# Patient Record
Sex: Male | Born: 1970
Health system: Southern US, Community
[De-identification: ages and names within clinical notes are randomized; demographics above are authoritative.]

## PROBLEM LIST (undated history)

## (undated) DIAGNOSIS — F909 Attention-deficit hyperactivity disorder, unspecified type: Secondary | ICD-10-CM

---

## 2010-08-31 ENCOUNTER — Ambulatory Visit: Payer: Self-pay | Admitting: Internal Medicine

## 2012-06-07 ENCOUNTER — Ambulatory Visit: Payer: Self-pay | Admitting: Internal Medicine

## 2013-07-03 IMAGING — CR RIGHT FOOT COMPLETE - 3+ VIEW
1 series · 3 of 3 positions shown · non-contrast
Comparison: none

REASON FOR EXAM: right foot pain
COMMENTS:

PROCEDURE:     MDR - MDR FOOT RT COMP W/OBLIQUES  - June 07, 2012  [DATE]
RESULT:     Comparison:  None

[Series 1: ap · 0.17mm/px · 3 of 3 slices shown]
[im 1/3]
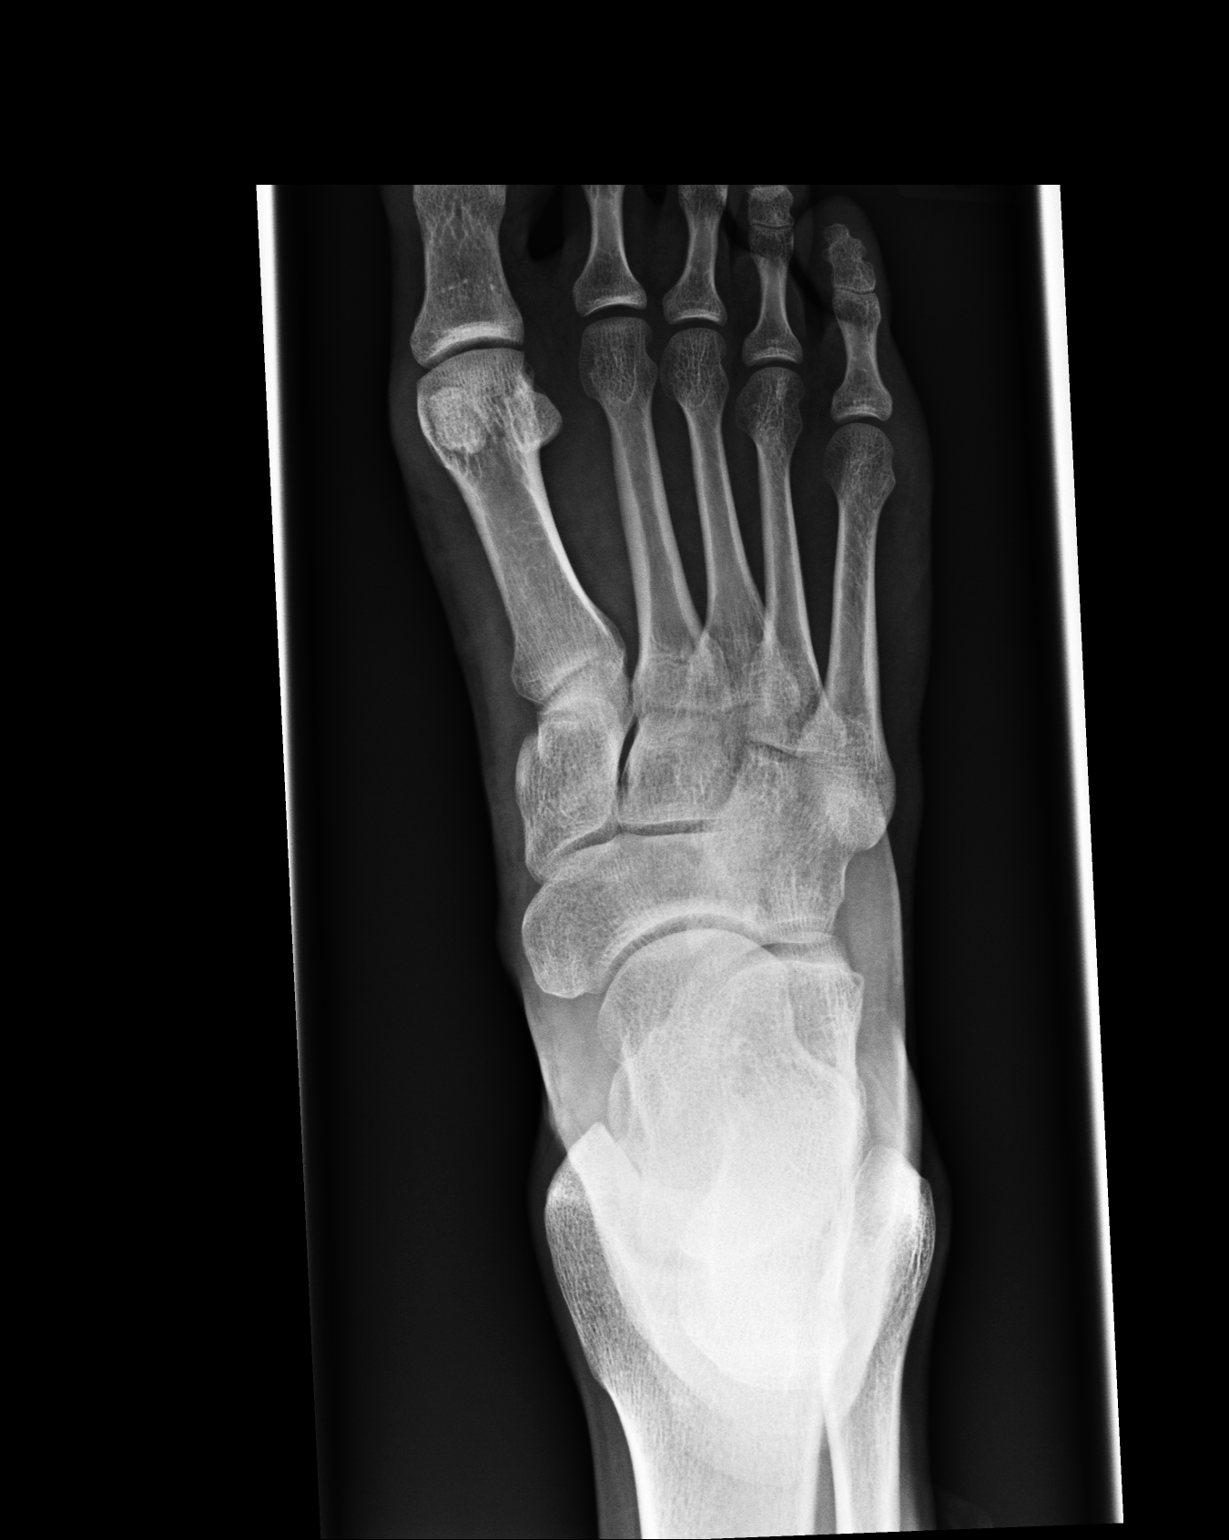
[im 2/3]
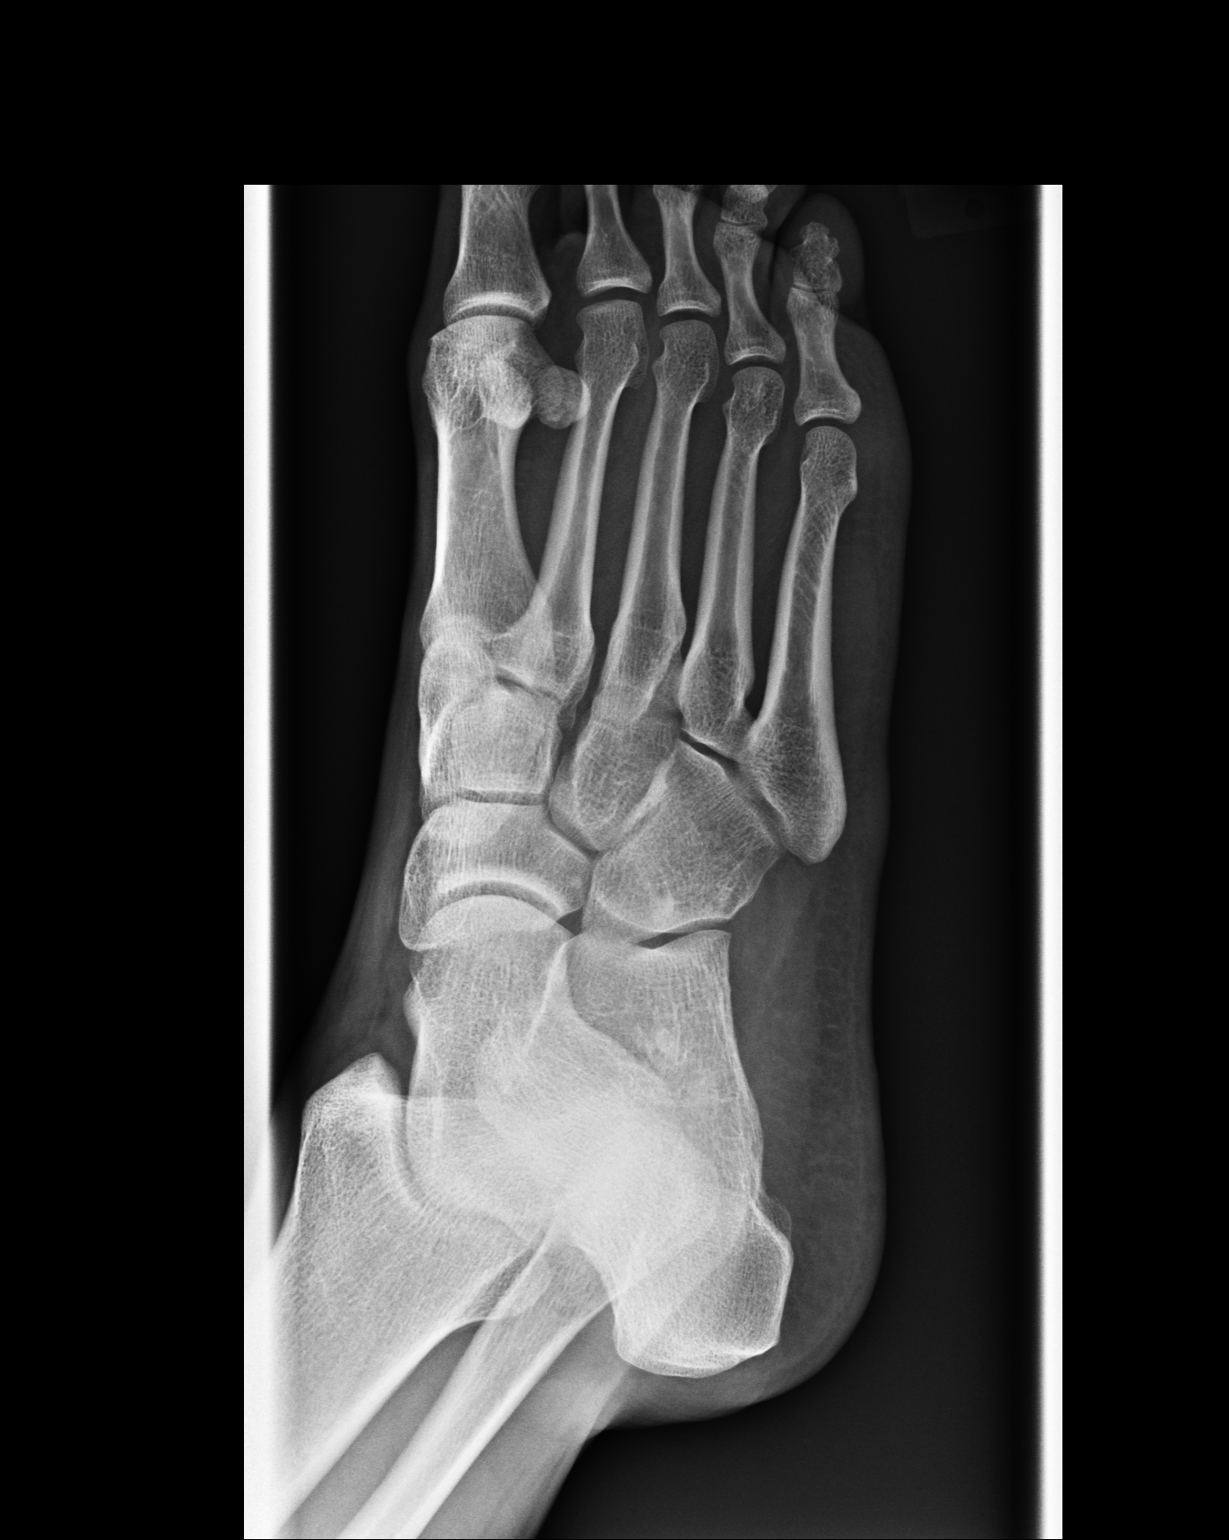
[im 3/3]
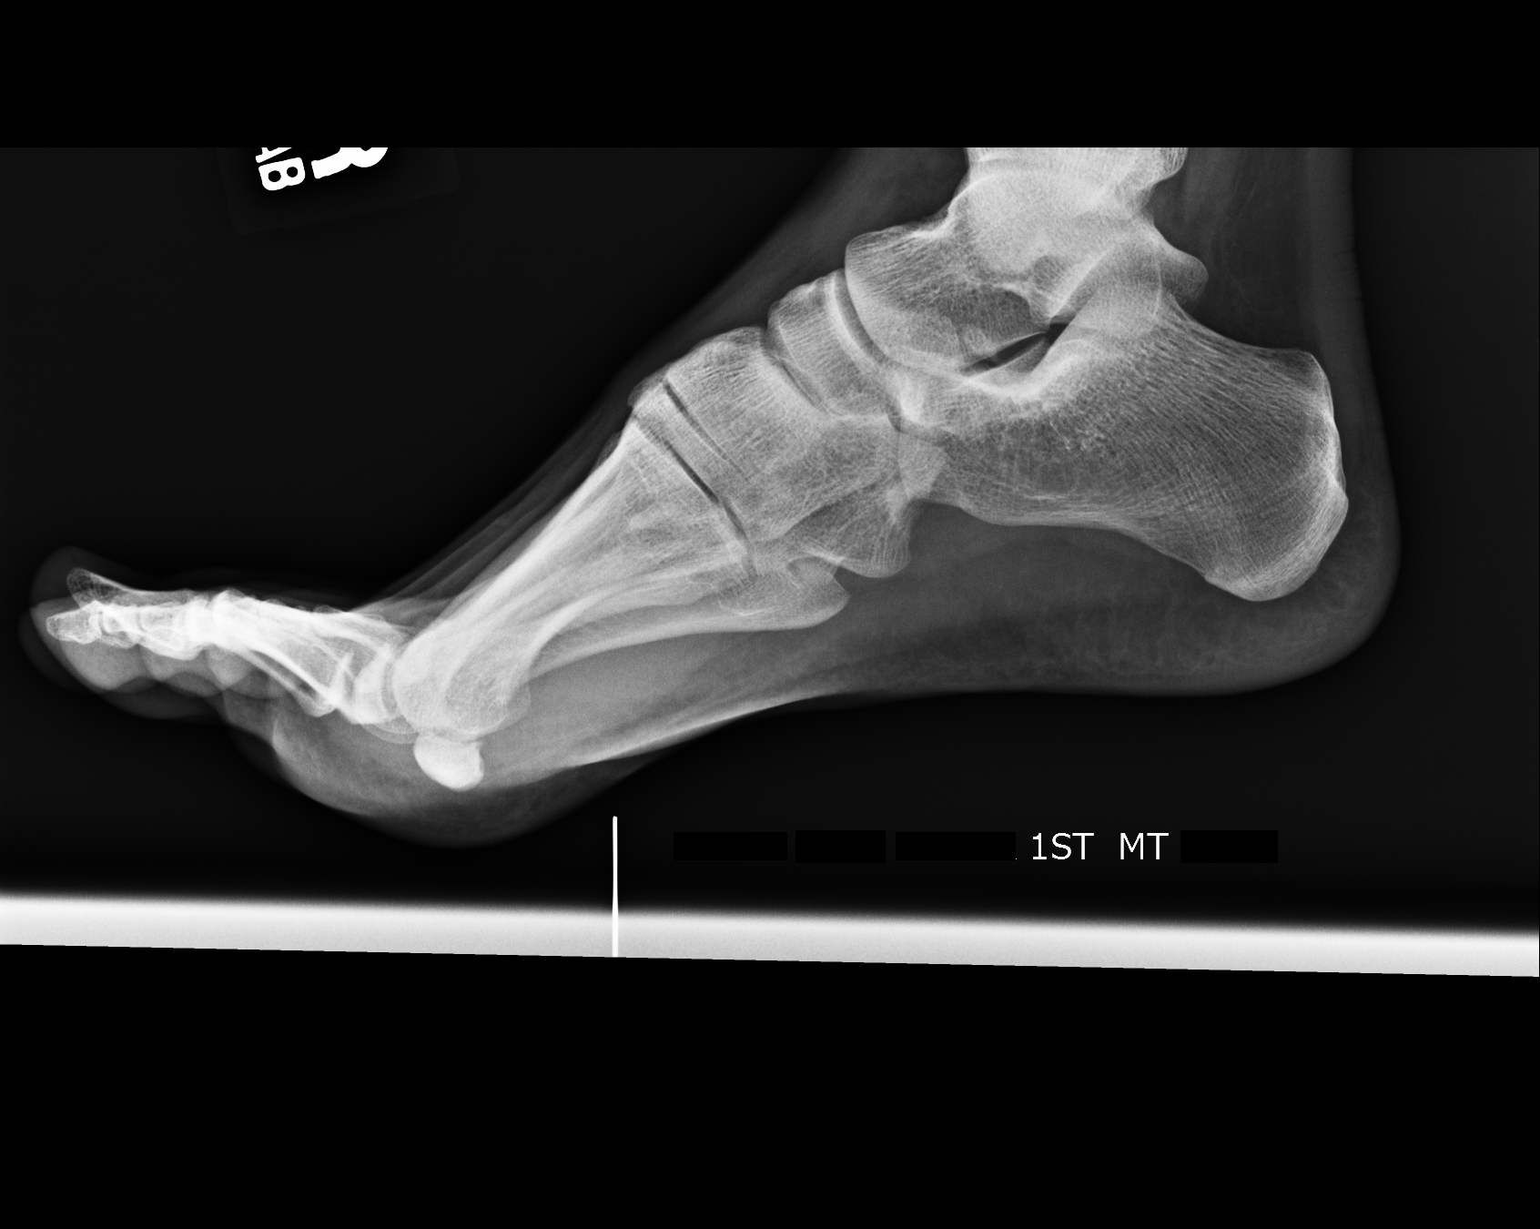

[3 of 3 positions shown; findings below may reference images not displayed]

FINDINGS: AP, oblique, and lateral views of the right foot demonstrates no fracture or
dislocation. There is no soft tissue abnormality. There is no subcutaneous
emphysema or radiopaque foreign bodies.
IMPRESSION: No acute osseous injury of the right foot.

[REDACTED]

## 2015-02-06 ENCOUNTER — Ambulatory Visit
Admission: EM | Admit: 2015-02-06 | Discharge: 2015-02-06 | Disposition: A | Discharge status: Home / Self Care | Payer: Managed Care, Other (non HMO) | Attending: Internal Medicine | Admitting: Internal Medicine

## 2015-02-06 DIAGNOSIS — T148XXA Other injury of unspecified body region, initial encounter: Secondary | ICD-10-CM

## 2015-02-06 DIAGNOSIS — T148 Other injury of unspecified body region: Secondary | ICD-10-CM

## 2015-02-06 DIAGNOSIS — L810 Postinflammatory hyperpigmentation: Secondary | ICD-10-CM | POA: Diagnosis not present

## 2015-02-06 HISTORY — DX: Attention-deficit hyperactivity disorder, unspecified type: F90.9

## 2015-02-06 NOTE — ED Provider Notes (Signed)
CSN: [id number]     Arrival date & time 02/06/15  1412 History   First MD Initiated Contact with Patient 02/06/15 1445     Chief Complaint  Patient presents with  . Rash   HPI  Patient is a 44 year old gentleman with a mark under his left arm, present for about a week,? Minor trauma. Not really painful, does not itch. No other marks like it elsewhere. He is also concerned about persistent marks from an episode of shingles 8 weeks ago, on the right chest. He has tried over-the-counter fade creams without sufficient results. No fever, no malaise. 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  Past Medical History  Diagnosis Date  . Attention deficit disorder with hyperactivity    History reviewed. No pertinent past surgical history. Family History  Problem Relation Age of Onset  . Cirrhosis Mother    History  Substance Use Topics  . Smoking status: Former Games developer  . Smokeless tobacco: Not on file  . Alcohol Use: Yes     Comment: socially    Review of Systems  All other systems reviewed and are negative.   Allergies  Codeine sulfate  Home Medications   Prior to Admission medications   Medication Sig Start Date End Date Taking? Authorizing Provider  amphetamine-dextroamphetamine (ADDERALL XR) 10 MG 24 hr capsule Take 10 mg by mouth 4 (four) times daily as needed.   Yes Historical Provider, MD  amphetamine-dextroamphetamine (ADDERALL) 30 MG tablet Take 30 mg by mouth 2 (two) times daily.   Yes Historical Provider, MD   BP 155/103 mmHg  Pulse 105  Temp(Src) 97.9 F (36.6 C) (Oral)  Resp 17  Ht 5\' 11"  (1.803 m)  Wt 179 lb (81.194 kg)  BMI 24.98 kg/m2  SpO2 97% Physical Exam  Constitutional: He is oriented to person, place, and time. No distress.  Alert, nicely groomed  HENT:  Head: Atraumatic.  Eyes:  Conjugate gaze, no eye redness/drainage  Neck: Neck supple.  Cardiovascular: Normal rate.   Pulmonary/Chest: No respiratory distress.  Abdominal: Soft. He exhibits no  distension.  Musculoskeletal: Normal range of motion.  Neurological: He is alert and oriented to person, place, and time.  Skin: Skin is warm and dry.  No cyanosis 1 x 1.5" fading bruise under the left arm in the left mid chest. No erythema, skin is intact. Not tender. Not swollen. Hyperpigmented patches on the right chest consistent with prior history of shingles. These are partially blanching.  Nursing note and vitals reviewed.   ED Course  Procedures   MDM   1. Post-inflammatory hyperpigmentation   2. Bruise of muscle    No evidence of dangerous underlying condition on exam today. A dermatologist might have thoughts about other options for continuing to fade the post-shingles rash. Recheck as needed     Eustace Moore, MD 02/06/15 [phone]

## 2015-02-06 NOTE — Discharge Instructions (Signed)
Marks from shingles will continue to fade over the next 6-12 weeks. A little sun may be helpful in disguising the marks while they heal. A dermatologist may have other suggestions. The mark under the L arm is a bruise and will continue to fade in the next 1-2 weeks.

## 2015-02-06 NOTE — ED Notes (Signed)
Noted a ?bite? ? Rash? Size of 25 cent piece left lateral upper chest today. Had shingles 2 months ago on right anterior ribs

## 2015-02-13 ENCOUNTER — Other Ambulatory Visit: Payer: Self-pay | Admitting: Internal Medicine

## 2015-02-18 ENCOUNTER — Encounter: Payer: Self-pay | Admitting: Internal Medicine

## 2015-02-28 ENCOUNTER — Other Ambulatory Visit: Payer: Self-pay | Admitting: Internal Medicine

## 2015-02-28 MED ORDER — TESTOSTERONE 50 MG/5GM (1%) TD GEL: 10.0000 g | Freq: Every day | TRANSDERMAL | Status: DC

## 2015-03-14 ENCOUNTER — Ambulatory Visit: Payer: Self-pay | Admitting: Internal Medicine

## 2015-03-21 ENCOUNTER — Other Ambulatory Visit: Payer: Self-pay | Admitting: Internal Medicine

## 2015-04-11 ENCOUNTER — Ambulatory Visit: Payer: Self-pay | Admitting: Internal Medicine

## 2015-08-20 ENCOUNTER — Ambulatory Visit (INDEPENDENT_AMBULATORY_CARE_PROVIDER_SITE_OTHER): Payer: 59 | Admitting: Family Medicine

## 2015-08-20 ENCOUNTER — Encounter: Payer: Self-pay | Admitting: Family Medicine

## 2015-08-20 VITALS — BP 118/86 | HR 83 | Temp 97.3°F | Ht 71.0 in | Wt 170.1 lb

## 2015-08-20 DIAGNOSIS — Z23 Encounter for immunization: Secondary | ICD-10-CM

## 2015-08-20 DIAGNOSIS — J40 Bronchitis, not specified as acute or chronic: Secondary | ICD-10-CM | POA: Diagnosis not present

## 2015-08-20 DIAGNOSIS — E291 Testicular hypofunction: Secondary | ICD-10-CM

## 2015-08-20 DIAGNOSIS — E039 Hypothyroidism, unspecified: Secondary | ICD-10-CM | POA: Diagnosis not present

## 2015-08-20 DIAGNOSIS — F909 Attention-deficit hyperactivity disorder, unspecified type: Secondary | ICD-10-CM | POA: Diagnosis not present

## 2015-08-20 DIAGNOSIS — F988 Other specified behavioral and emotional disorders with onset usually occurring in childhood and adolescence: Secondary | ICD-10-CM

## 2015-08-20 MED ORDER — DOXYCYCLINE HYCLATE 100 MG PO CAPS: 100.0000 mg | ORAL_CAPSULE | Freq: Two times a day (BID) | ORAL | Status: DC

## 2015-08-20 NOTE — Patient Instructions (Signed)

## 2015-08-21 DIAGNOSIS — E039 Hypothyroidism, unspecified: Secondary | ICD-10-CM | POA: Insufficient documentation

## 2015-08-21 DIAGNOSIS — E291 Testicular hypofunction: Secondary | ICD-10-CM | POA: Insufficient documentation

## 2015-08-21 DIAGNOSIS — F988 Other specified behavioral and emotional disorders with onset usually occurring in childhood and adolescence: Secondary | ICD-10-CM | POA: Insufficient documentation

## 2015-08-21 NOTE — Progress Notes (Signed)
Date:  08/20/2015   Name:  Nicholas PippinsWalter G Hindle Jr.   DOB:  06-19-1971   MRN:  [id number]  PCP:  No primary care provider on file.    Chief Complaint: Cough   History of Present Illness:  This is a 45 y.o. male with 2 week hx of cough prod yellow phlegm, sore throat, rhinorrhea, and sinus congestion, feels sxs getting worse. Needs flu imm.  Review of Systems:  Review of Systems  Constitutional: Negative for fatigue.  HENT: Negative for ear pain.   Respiratory: Negative for shortness of breath.     There are no active problems to display for this patient.   Prior to Admission medications   Medication Sig Start Date End Date Taking? Authorizing Provider  ARIPiprazole (ABILIFY) 20 MG tablet Take 20 mg by mouth every morning. 02/06/15  Yes Historical Provider, MD  levothyroxine (SYNTHROID, LEVOTHROID) 75 MCG tablet TAKE 1 TABLET BY MOUTH EVERY DAY 03/23/15  Yes Reubin MilanLaura H Berglund, MD  methylphenidate 36 MG PO CR tablet Take 1 tablet by mouth 2 (two) times daily. 02/23/15  Yes Historical Provider, MD  testosterone (ANDROGEL) 50 MG/5GM (1%) GEL Place 10 g onto the skin daily. 02/28/15  Yes Reubin MilanLaura H Berglund, MD  doxycycline (VIBRAMYCIN) 100 MG capsule Take 1 capsule (100 mg total) by mouth 2 (two) times daily. 08/20/15   Schuyler AmorWilliam Zain Lankford, MD    Allergies  Allergen Reactions  . Biaxin [Clarithromycin] Diarrhea  . Codeine Sulfate Itching    No past surgical history on file.  Social History  Substance Use Topics  . Smoking status: Former Smoker    Quit date: 12/15/2010  . Smokeless tobacco: None  . Alcohol Use: 0.0 oz/week    0 Standard drinks or equivalent per week     Comment: socially    Family History  Problem Relation Age of Onset  . Cirrhosis Mother   . Hypothyroidism Father   . Diabetes Maternal Grandmother   . Diabetes Paternal Grandmother     Medication list has been reviewed and updated.  Physical Examination: BP 118/86 mmHg  Pulse 83  Temp(Src) 97.3 F (36.3 C)  Ht 5\' 11"   (1.803 m)  Wt 170 lb 1.6 oz (77.157 kg)  BMI 23.73 kg/m2  SpO2 98%  Physical Exam  Constitutional: He appears well-developed and well-nourished.  HENT:  Nose: Nose normal.  Mouth/Throat: Oropharynx is clear and moist.  Neck: Neck supple.  Pulmonary/Chest: Effort normal and breath sounds normal.  Lymphadenopathy:    He has no cervical adenopathy.  Neurological: He is alert.  Skin: Skin is warm and dry.  Psychiatric: He has a normal mood and affect. His behavior is normal.  Nursing note and vitals reviewed.   Assessment and Plan:  1. Bronchitis, persistent Doxy x 5d (allergic Biaxin)  2. Flu vaccine need - Flu Vaccine QUAD 36+ mos PF IM (Fluarix & Fluzone Quad PF)  Return if symptoms worsen or fail to improve.  Dionne AnoWilliam M. Kingsley SpittlePlonk, Jr. MD [facility]  08/21/2015

## 2015-09-01 ENCOUNTER — Other Ambulatory Visit: Payer: Self-pay | Admitting: Internal Medicine

## 2015-09-02 ENCOUNTER — Other Ambulatory Visit: Payer: Self-pay | Admitting: Internal Medicine

## 2015-09-15 ENCOUNTER — Other Ambulatory Visit: Payer: Self-pay | Admitting: Internal Medicine

## 2015-09-15 MED ORDER — TESTOSTERONE 50 MG/5GM (1%) TD GEL: 10.0000 g | Freq: Every day | TRANSDERMAL | Status: DC

## 2015-09-15 NOTE — Telephone Encounter (Signed)
pts coming in on march 7th

## 2015-10-21 ENCOUNTER — Ambulatory Visit: Payer: 59 | Admitting: Internal Medicine

## 2015-10-27 ENCOUNTER — Ambulatory Visit (INDEPENDENT_AMBULATORY_CARE_PROVIDER_SITE_OTHER): Payer: 59 | Admitting: Internal Medicine

## 2015-10-27 ENCOUNTER — Encounter: Payer: Self-pay | Admitting: Internal Medicine

## 2015-10-27 VITALS — BP 120/62 | HR 104 | Ht 71.0 in | Wt 171.0 lb

## 2015-10-27 DIAGNOSIS — R0981 Nasal congestion: Secondary | ICD-10-CM | POA: Diagnosis not present

## 2015-10-27 DIAGNOSIS — G44219 Episodic tension-type headache, not intractable: Secondary | ICD-10-CM

## 2015-10-27 MED ORDER — FLUTICASONE PROPIONATE 50 MCG/ACT NA SUSP: 2.0000 | Freq: Every day | NASAL | Status: DC

## 2015-10-27 NOTE — Progress Notes (Signed)
    Date:  10/27/2015   Name:  Nicholas PippinsWalter G Prasad Jr.   DOB:  [birth year]   MRN:  [id number]   Chief Complaint: Migraine Migraine  This is a new problem. The current episode started in the past 7 days. The problem occurs constantly. Associated symptoms include sinus pressure and a sore throat. Pertinent negatives include no ear pain or fever.  Sinusitis This is a new problem. The current episode started in the past 7 days. The problem is unchanged. Associated symptoms include congestion, headaches, sinus pressure and a sore throat. Pertinent negatives include no diaphoresis, ear pain or shortness of breath.      Review of Systems  Constitutional: Negative for fever, diaphoresis and unexpected weight change.  HENT: Positive for congestion, sinus pressure and sore throat. Negative for ear pain.   Eyes: Negative for visual disturbance.  Respiratory: Negative for chest tightness and shortness of breath.   Cardiovascular: Negative for chest pain and palpitations.  Neurological: Positive for headaches.  Psychiatric/Behavioral: Positive for sleep disturbance. The patient is nervous/anxious.     Patient Active Problem List   Diagnosis Date Noted  . Hypothyroidism 08/21/2015  . Hypogonadism, male 08/21/2015  . ADD (attention deficit disorder) 08/21/2015    Prior to Admission medications   Medication Sig Start Date End Date Taking? Authorizing Provider  ALPRAZolam Prudy Feeler(XANAX) 0.5 MG tablet Take 1 tablet by mouth daily. 10/13/15  Yes Historical Provider, MD  ARIPiprazole (ABILIFY) 30 MG tablet Take 30 mg by mouth every morning. 09/01/15  Yes Historical Provider, MD  cloNIDine (CATAPRES) 0.1 MG tablet Take 1 tablet by mouth daily. 10/13/15  Yes Historical Provider, MD  levothyroxine (SYNTHROID, LEVOTHROID) 75 MCG tablet TAKE 1 TABLET BY MOUTH EVERY DAY 03/23/15  Yes Reubin MilanLaura H Berglund, MD  testosterone (ANDROGEL) 50 MG/5GM (1%) GEL Place 10 g onto the skin daily. Patient not taking: Reported on 10/27/2015  09/15/15   Reubin MilanLaura H Berglund, MD    Allergies  Allergen Reactions  . Biaxin [Clarithromycin] Diarrhea  . Codeine Sulfate Itching    History reviewed. No pertinent past surgical history.  Social History  Substance Use Topics  . Smoking status: Former Smoker    Quit date: 12/15/2010  . Smokeless tobacco: None  . Alcohol Use: 0.0 oz/week    0 Standard drinks or equivalent per week     Comment: socially    Medication list has been reviewed and updated.  Physical Exam  Constitutional: He appears well-developed and well-nourished.  HENT:  Right Ear: Ear canal normal. Tympanic membrane is retracted.  Left Ear: Ear canal normal. Tympanic membrane is retracted.  Nose: Right sinus exhibits no maxillary sinus tenderness. Left sinus exhibits no maxillary sinus tenderness.  Neck: Normal range of motion. Neck supple. No thyromegaly present.  Cardiovascular: Normal rate, regular rhythm and normal heart sounds.   Pulmonary/Chest: Effort normal and breath sounds normal.  Lymphadenopathy:    He has no cervical adenopathy.  Nursing note and vitals reviewed.   BP 120/62 mmHg  Pulse 104  Ht 5\' 11"  (1.803 m)  Wt 171 lb (77.565 kg)  BMI 23.86 kg/m2  Assessment and Plan: 1. Sinus congestion No evidence of bacterial infection - fluticasone (FLONASE) 50 MCG/ACT nasal spray; Place 2 sprays into both nostrils daily.  Dispense: 16 g; Refill: 6  2. Episodic tension-type headache, not intractable Take tylenol as needed Note given to be out of work   Bari EdwardLaura Berglund, MD [facility]  10/27/2015

## 2015-11-04 ENCOUNTER — Ambulatory Visit (INDEPENDENT_AMBULATORY_CARE_PROVIDER_SITE_OTHER): Payer: 59 | Admitting: Internal Medicine

## 2015-11-04 ENCOUNTER — Encounter: Payer: Self-pay | Admitting: Internal Medicine

## 2015-11-04 VITALS — BP 106/72 | HR 80 | Temp 97.8°F | Ht 71.0 in | Wt 165.6 lb

## 2015-11-04 DIAGNOSIS — K529 Noninfective gastroenteritis and colitis, unspecified: Secondary | ICD-10-CM | POA: Diagnosis not present

## 2015-11-04 MED ORDER — ONDANSETRON HCL 4 MG PO TABS: 4.0000 mg | ORAL_TABLET | Freq: Three times a day (TID) | ORAL | Status: DC | PRN

## 2015-11-04 NOTE — Progress Notes (Signed)
    Date:  11/04/2015   Name:  Nicholas PippinsWalter G Matera Jr.   DOB:  03-Sep-1970   MRN:  [id number]   Chief Complaint: Emesis Emesis  This is a new problem. The current episode started yesterday. The problem occurs less than 2 times per day. The emesis has an appearance of stomach contents. The maximum temperature recorded prior to his arrival was 100.4 - 100.9 F. The fever has been present for less than 1 day. Associated symptoms include abdominal pain, diarrhea and a fever. Pertinent negatives include no chills, coughing, dizziness or myalgias. He has tried acetaminophen for the symptoms. The treatment provided mild relief.    Review of Systems  Constitutional: Positive for fever. Negative for chills.  Respiratory: Negative for cough.   Gastrointestinal: Positive for vomiting, abdominal pain and diarrhea.  Musculoskeletal: Negative for myalgias.  Neurological: Negative for dizziness.    Patient Active Problem List   Diagnosis Date Noted  . Hypothyroidism 08/21/2015  . Hypogonadism, male 08/21/2015  . ADD (attention deficit disorder) 08/21/2015    Prior to Admission medications   Medication Sig Start Date End Date Taking? Authorizing Provider  ALPRAZolam Prudy Feeler(XANAX) 0.5 MG tablet Take 1 tablet by mouth daily. 10/13/15  Yes Historical Provider, MD  ARIPiprazole (ABILIFY) 30 MG tablet Take 30 mg by mouth every morning. 09/01/15  Yes Historical Provider, MD  cloNIDine (CATAPRES) 0.1 MG tablet Take 1 tablet by mouth daily. 10/13/15  Yes Historical Provider, MD  fluticasone (FLONASE) 50 MCG/ACT nasal spray Place 2 sprays into both nostrils daily. 10/27/15  Yes Reubin MilanLaura H Arriah Wadle, MD  levothyroxine (SYNTHROID, LEVOTHROID) 75 MCG tablet TAKE 1 TABLET BY MOUTH EVERY DAY 03/23/15  Yes Reubin MilanLaura H Abelina Ketron, MD  testosterone (ANDROGEL) 50 MG/5GM (1%) GEL Place 10 g onto the skin daily. Patient not taking: Reported on 11/04/2015 09/15/15   Reubin MilanLaura H Lakesia Dahle, MD    Allergies  Allergen Reactions  . Biaxin [Clarithromycin]  Diarrhea  . Codeine Sulfate Itching    History reviewed. No pertinent past surgical history.  Social History  Substance Use Topics  . Smoking status: Former Smoker    Quit date: 12/15/2010  . Smokeless tobacco: None  . Alcohol Use: 0.0 oz/week    0 Standard drinks or equivalent per week     Comment: socially     Medication list has been reviewed and updated.   Physical Exam  Constitutional: He appears well-developed. He appears ill.  Cardiovascular: Normal rate, regular rhythm and normal heart sounds.   Pulmonary/Chest: Effort normal and breath sounds normal.  Abdominal: Soft. Normal appearance. Bowel sounds are decreased. There is no hepatosplenomegaly. There is no tenderness. There is no rebound and no CVA tenderness.  Psychiatric: His speech is normal.  Nursing note and vitals reviewed.   BP 106/72 mmHg  Pulse 80  Temp(Src) 97.8 F (36.6 C) (Oral)  Ht 5\' 11"  (1.803 m)  Wt 165 lb 9.6 oz (75.116 kg)  BMI 23.11 kg/m2  SpO2 96%  Assessment and Plan: 1. Gastroenteritis Recommend BRAT diet, plenty of liquids Note to be out of work until next week - ondansetron (ZOFRAN) 4 MG tablet; Take 1 tablet (4 mg total) by mouth every 8 (eight) hours as needed for nausea or vomiting.  Dispense: 20 tablet; Refill: 0   Bari EdwardLaura Liam Cammarata, MD [facility]  11/04/2015

## 2015-11-04 NOTE — Patient Instructions (Signed)

## 2016-03-31 ENCOUNTER — Other Ambulatory Visit: Payer: Self-pay | Admitting: Internal Medicine

## 2016-04-06 NOTE — Telephone Encounter (Signed)
Unable to reach pt,pts number is not in service.

## 2016-05-25 ENCOUNTER — Other Ambulatory Visit: Payer: Self-pay | Admitting: Internal Medicine

## 2016-07-28 ENCOUNTER — Other Ambulatory Visit: Payer: Self-pay | Admitting: Internal Medicine

## 2016-08-17 ENCOUNTER — Other Ambulatory Visit: Payer: Self-pay | Admitting: Internal Medicine

## 2017-04-19 ENCOUNTER — Ambulatory Visit: Payer: 59 | Admitting: Internal Medicine

## 2017-04-21 ENCOUNTER — Ambulatory Visit (INDEPENDENT_AMBULATORY_CARE_PROVIDER_SITE_OTHER): Payer: BLUE CROSS/BLUE SHIELD | Admitting: Internal Medicine

## 2017-04-21 ENCOUNTER — Encounter: Payer: Self-pay | Admitting: Internal Medicine

## 2017-04-21 VITALS — BP 125/80 | HR 74 | Ht 71.0 in | Wt 184.2 lb

## 2017-04-21 DIAGNOSIS — E034 Atrophy of thyroid (acquired): Secondary | ICD-10-CM | POA: Diagnosis not present

## 2017-04-21 DIAGNOSIS — F17200 Nicotine dependence, unspecified, uncomplicated: Secondary | ICD-10-CM | POA: Diagnosis not present

## 2017-04-21 DIAGNOSIS — R5383 Other fatigue: Secondary | ICD-10-CM | POA: Diagnosis not present

## 2017-04-21 DIAGNOSIS — E291 Testicular hypofunction: Secondary | ICD-10-CM | POA: Diagnosis not present

## 2017-04-21 MED ORDER — VARENICLINE TARTRATE 1 MG PO TABS: 1.0000 mg | ORAL_TABLET | Freq: Two times a day (BID) | ORAL | 2 refills | Status: DC

## 2017-04-21 MED ORDER — VARENICLINE TARTRATE 0.5 MG X 11 & 1 MG X 42 PO MISC: ORAL | 0 refills | Status: DC

## 2017-04-21 NOTE — Progress Notes (Signed)
Date:  04/21/2017   Name:  Nicholas PippinsWalter G Swiatek Jr.   DOB:  Dec 06, 1970   MRN:  [id number]   Chief Complaint: Nicotine Dependence (Wants to discuss quiting smoking. ); Testosterone (Wants to get testosterone tested. last time was Low. ); and Hypothyroidism (Wants to check on thyroid- states he is tired a lot throughout the day. Wants to know if can get sleep study because having insomnia at night. ) Nicotine Dependence  Presents for initial visit. Symptoms include fatigue. Symptoms are negative for decreased concentration. Preferred tobacco types include cigarettes. His urge triggers include company of smokers.  Thyroid Problem  Presents for follow-up visit. Symptoms include fatigue and weight gain. Patient reports no constipation, depressed mood, diaphoresis, diarrhea, palpitations or visual change. The symptoms have been worsening (has been out of medication for months).   Low Testosterone - no longer on supplements due to loss of insurance.  Sleep disorder - his been taking Unisom to help him go to sleep. He feels like he rests fairly well but is fatigued during the day. He does wake up multiple times a night but not gasping. He doesn't currently have a bed partner so he is not aware of snoring. He is concerned because father has sleep apnea. Epworth scale 10/24.   Review of Systems  Constitutional: Positive for fatigue, unexpected weight change and weight gain. Negative for chills and diaphoresis.  Respiratory: Negative for chest tightness, shortness of breath and wheezing.   Cardiovascular: Negative for chest pain and palpitations.  Gastrointestinal: Negative for abdominal pain, constipation and diarrhea.  Hematological: Negative for adenopathy.  Psychiatric/Behavioral: Positive for sleep disturbance. Negative for decreased concentration and dysphoric mood.    Patient Active Problem List   Diagnosis Date Noted  . Hypogonadism, male 08/21/2015  . ADD (attention deficit disorder) 08/21/2015     Prior to Admission medications   Not on File    Allergies  Allergen Reactions  . Biaxin [Clarithromycin] Diarrhea  . Codeine Sulfate Itching    History reviewed. No pertinent surgical history.  Social History  Substance Use Topics  . Smoking status: Current Every Day Smoker    Packs/day: 1.00    Years: 1.00  . Smokeless tobacco: Never Used  . Alcohol use No     Medication list has been reviewed and updated.  PHQ 2/9 Scores 04/21/2017  PHQ - 2 Score 0    Physical Exam  Constitutional: He is oriented to person, place, and time. He appears well-developed. No distress.  HENT:  Head: Normocephalic and atraumatic.  Neck: Normal range of motion. Neck supple.  Cardiovascular: Normal rate, regular rhythm and normal heart sounds.   Pulmonary/Chest: Effort normal and breath sounds normal. No respiratory distress. He has no wheezes.  Musculoskeletal: Normal range of motion.  Neurological: He is alert and oriented to person, place, and time.  Skin: Skin is warm and dry. No rash noted.  Psychiatric: He has a normal mood and affect. His behavior is normal. Thought content normal.  Nursing note and vitals reviewed.   BP 125/80   Pulse 74   Ht 5\' 11"  (1.803 m)   Wt 184 lb 3.2 oz (83.6 kg)   SpO2 98%   BMI 25.69 kg/m   Assessment and Plan: 1. Hypogonadism, male Will recheck and consider supplementation - Testosterone  2. Hypothyroidism due to acquired atrophy of thyroid Will resume medication if indicated - TSH  3. Tobacco use disorder chantix recommended  4. Fatigue, unspecified type Hold off on sleep study  for now as thyroid and hypogonadism may be contributing - CBC with Differential/Platelet - Comprehensive metabolic panel   Meds ordered this encounter  Medications  . varenicline (CHANTIX STARTING MONTH PAK) 0.5 MG X 11 & 1 MG X 42 tablet    Sig: Take one 0.5 mg tablet by mouth once daily for 3 days, then increase to one 0.5 mg tablet twice daily for 4  days, then increase to one 1 mg tablet twice daily.    Dispense:  53 tablet    Refill:  0  . varenicline (CHANTIX CONTINUING MONTH PAK) 1 MG tablet    Sig: Take 1 tablet (1 mg total) by mouth 2 (two) times daily.    Dispense:  60 tablet    Refill:  2    Partially dictated using Animal nutritionist. Any errors are unintentional.  Bari Edward, MD [facility]  04/21/2017

## 2017-04-22 ENCOUNTER — Other Ambulatory Visit: Payer: Self-pay | Admitting: Internal Medicine

## 2017-04-22 LAB — COMPREHENSIVE METABOLIC PANEL
ALT: 38 IU/L (ref 0–44)
AST: 29 IU/L (ref 0–40)
Albumin/Globulin Ratio: 1.8 (ref 1.2–2.2)
Albumin: 4.7 g/dL (ref 3.5–5.5)
Alkaline Phosphatase: 76 IU/L (ref 39–117)
BUN/Creatinine Ratio: 10 (ref 9–20)
BUN: 9 mg/dL (ref 6–24)
Bilirubin Total: 0.4 mg/dL (ref 0.0–1.2)
CO2: 24 mmol/L (ref 20–29)
Calcium: 9.4 mg/dL (ref 8.7–10.2)
Chloride: 98 mmol/L (ref 96–106)
Creatinine, Ser: 0.91 mg/dL (ref 0.76–1.27)
GFR calc Af Amer: 118 mL/min/{1.73_m2} (ref >59)
GFR calc non Af Amer: 102 mL/min/{1.73_m2} (ref >59)
Globulin, Total: 2.6 g/dL (ref 1.5–4.5)
Glucose: 89 mg/dL (ref 65–99)
Potassium: 4.4 mmol/L (ref 3.5–5.2)
Sodium: 139 mmol/L (ref 134–144)
Total Protein: 7.3 g/dL (ref 6.0–8.5)

## 2017-04-22 LAB — CBC WITH DIFFERENTIAL/PLATELET
Basophils Absolute: 0.1 10*3/uL (ref 0.0–0.2)
Basos: 1 %
EOS (ABSOLUTE): 0.3 10*3/uL (ref 0.0–0.4)
Eos: 5 %
Hematocrit: 43.9 % (ref 37.5–51.0)
Hemoglobin: 15.1 g/dL (ref 13.0–17.7)
Immature Grans (Abs): 0.1 10*3/uL (ref 0.0–0.1)
Immature Granulocytes: 1 %
Lymphocytes Absolute: 1.4 10*3/uL (ref 0.7–3.1)
Lymphs: 24 %
MCH: 32.1 pg (ref 26.6–33.0)
MCHC: 34.4 g/dL (ref 31.5–35.7)
MCV: 93 fL (ref 79–97)
Monocytes Absolute: 1.1 10*3/uL — ABNORMAL HIGH (ref 0.1–0.9)
Monocytes: 19 %
Neutrophils Absolute: 3 10*3/uL (ref 1.4–7.0)
Neutrophils: 50 %
Platelets: 305 10*3/uL (ref 150–379)
RBC: 4.7 x10E6/uL (ref 4.14–5.80)
RDW: 13.1 % (ref 12.3–15.4)
WBC: 6 10*3/uL (ref 3.4–10.8)

## 2017-04-22 LAB — TESTOSTERONE: Testosterone: 503 ng/dL (ref 264–916)

## 2017-04-22 LAB — TSH: TSH: 5.04 u[IU]/mL — ABNORMAL HIGH (ref 0.450–4.500)

## 2017-04-22 MED ORDER — LEVOTHYROXINE SODIUM 50 MCG PO TABS: 50.0000 ug | ORAL_TABLET | Freq: Every day | ORAL | 1 refills | Status: DC

## 2017-04-23 DIAGNOSIS — Z23 Encounter for immunization: Secondary | ICD-10-CM | POA: Diagnosis not present

## 2017-07-06 ENCOUNTER — Encounter: Payer: Self-pay | Admitting: Internal Medicine

## 2017-07-06 ENCOUNTER — Ambulatory Visit: Payer: BLUE CROSS/BLUE SHIELD | Admitting: Internal Medicine

## 2017-07-06 VITALS — BP 124/84 | HR 62 | Ht 71.0 in | Wt 189.0 lb

## 2017-07-06 DIAGNOSIS — E034 Atrophy of thyroid (acquired): Secondary | ICD-10-CM

## 2017-07-06 DIAGNOSIS — E782 Mixed hyperlipidemia: Secondary | ICD-10-CM

## 2017-07-06 DIAGNOSIS — F5101 Primary insomnia: Secondary | ICD-10-CM

## 2017-07-06 MED ORDER — ZOLPIDEM TARTRATE ER 12.5 MG PO TBCR: 12.5000 mg | EXTENDED_RELEASE_TABLET | Freq: Every evening | ORAL | 2 refills | Status: DC | PRN

## 2017-07-06 NOTE — Progress Notes (Signed)
Date:  07/06/2017   Name:  Nicholas PippinsWalter G Buchmann Jr.   DOB:  1971-03-10   MRN:  [id number]   Chief Complaint: Hypothyroidism (Told to come back to recheck thyroid levels. )  Thyroid Problem  Presents for follow-up visit. Symptoms include anxiety and fatigue. Patient reports no cold intolerance, constipation, depressed mood, diarrhea, heat intolerance, visual change, weight gain or weight loss. The symptoms have been stable. His past medical history is significant for hyperlipidemia.  Insomnia  Primary symptoms: sleep disturbance, difficulty falling asleep, frequent awakening.  The onset quality is gradual. The problem has been gradually worsening since onset. Treatments tried: melatonin, benadryl.  Hyperlipidemia  This is a chronic problem. The problem is uncontrolled. Recent lipid tests were reviewed and are variable. There are no known factors aggravating his hyperlipidemia. Pertinent negatives include no chest pain or shortness of breath. Current antihyperlipidemic treatment includes diet change. Compliance problems include adherence to exercise.   His anxiety is worse - he is being laid off - his company is declaring bankruptcy.  He is trying to find another job and needs to be able to focus.  Sleep is very poor due to frequent wakening.  He has used [place] in the past and would like to try it again.   Review of Systems  Constitutional: Positive for fatigue. Negative for chills, fever, unexpected weight change, weight gain and weight loss.  Eyes: Negative for visual disturbance.  Respiratory: Negative for chest tightness and shortness of breath.   Cardiovascular: Negative for chest pain.  Gastrointestinal: Negative for constipation and diarrhea.  Endocrine: Negative for cold intolerance and heat intolerance.  Hematological: Negative for adenopathy.  Psychiatric/Behavioral: Positive for sleep disturbance. Negative for dysphoric mood. The patient is nervous/anxious and has insomnia.      Patient Active Problem List   Diagnosis Date Noted  . Hypothyroidism due to acquired atrophy of thyroid 04/21/2017  . Tobacco use disorder 04/21/2017  . Fatigue 04/21/2017  . Hypogonadism, male 08/21/2015  . ADD (attention deficit disorder) 08/21/2015    Prior to Admission medications   Medication Sig Start Date End Date Taking? Authorizing Provider  levothyroxine (SYNTHROID, LEVOTHROID) 50 MCG tablet Take 1 tablet (50 mcg total) by mouth daily. 04/22/17  Yes [name], [name], MD  varenicline (CHANTIX CONTINUING MONTH PAK) 1 MG tablet Take 1 tablet (1 mg total) by mouth 2 (two) times daily. Patient not taking: Reported on 07/06/2017 04/21/17   [name], [name], MD  varenicline (CHANTIX STARTING MONTH PAK) 0.5 MG X 11 & 1 MG X 42 tablet Take one 0.5 mg tablet by mouth once daily for 3 days, then increase to one 0.5 mg tablet twice daily for 4 days, then increase to one 1 mg tablet twice daily. Patient not taking: Reported on 07/06/2017 04/21/17   [name], [name], MD    Allergies  Allergen Reactions  . Biaxin [Clarithromycin] Diarrhea  . Codeine Sulfate Itching    History reviewed. No pertinent surgical history.  Social History   Tobacco Use  . Smoking status: Current Every Day Smoker    Packs/day: 1.00    Years: 1.00    Pack years: 1.00  . Smokeless tobacco: Never Used  Substance Use Topics  . Alcohol use: No    Alcohol/week: 0.0 oz  . Drug use: Not on file     Medication list has been reviewed and updated.  PHQ 2/9 Scores 04/21/2017  PHQ - 2 Score 0    Physical Exam  Constitutional: He is  oriented to person, place, and time. He appears well-developed. No distress.  HENT:  Head: Normocephalic and atraumatic.  Neck: Normal range of motion. Neck supple. No thyromegaly present.  Cardiovascular: Normal rate, regular rhythm and normal heart sounds.  Pulmonary/Chest: Effort normal and breath sounds normal. No respiratory distress. He has no wheezes.  Musculoskeletal:  Normal range of motion.  Neurological: He is alert and oriented to person, place, and time. He has normal reflexes.  Skin: Skin is warm, dry and intact. No rash noted.  Psychiatric: His speech is normal and behavior is normal. Judgment and thought content normal. His mood appears anxious. Cognition and memory are normal.  Nursing note and vitals reviewed.   BP 124/84   Pulse 62   Ht 5\' 11"  (1.803 m)   Wt 189 lb (85.7 kg)   BMI 26.36 kg/m   Assessment and Plan: 1. Hypothyroidism due to acquired atrophy of thyroid Adjust dose as needed - Thyroid Panel With TSH  2. Primary insomnia Try Remus Lofflerambien Some component of chronic anxiety - unclear if just situational at this time - Comprehensive metabolic panel - zolpidem (AMBIEN CR) 12.5 MG CR tablet; Take 1 tablet (12.5 mg total) by mouth at bedtime as needed for sleep.  Dispense: 30 tablet; Refill: 2  3. Mixed hyperlipidemia Work on exercise and low fat diet - Lipid panel   Meds ordered this encounter  Medications  . zolpidem (AMBIEN CR) 12.5 MG CR tablet    Sig: Take 1 tablet (12.5 mg total) by mouth at bedtime as needed for sleep.    Dispense:  30 tablet    Refill:  2    Partially dictated using Animal nutritionistDragon software. Any errors are unintentional.  Bari EdwardLaura Suesan Mohrmann, MD [facility]  07/06/2017

## 2017-07-07 LAB — COMPREHENSIVE METABOLIC PANEL
ALT: 24 IU/L (ref 0–44)
AST: 23 IU/L (ref 0–40)
Albumin/Globulin Ratio: 1.9 (ref 1.2–2.2)
Albumin: 4.7 g/dL (ref 3.5–5.5)
Alkaline Phosphatase: 89 IU/L (ref 39–117)
BUN/Creatinine Ratio: 13 (ref 9–20)
BUN: 11 mg/dL (ref 6–24)
Bilirubin Total: 0.4 mg/dL (ref 0.0–1.2)
CO2: 26 mmol/L (ref 20–29)
Calcium: 9.9 mg/dL (ref 8.7–10.2)
Chloride: 99 mmol/L (ref 96–106)
Creatinine, Ser: 0.85 mg/dL (ref 0.76–1.27)
GFR calc Af Amer: 123 mL/min/{1.73_m2} (ref >59)
GFR calc non Af Amer: 106 mL/min/{1.73_m2} (ref >59)
Globulin, Total: 2.5 g/dL (ref 1.5–4.5)
Glucose: 100 mg/dL — ABNORMAL HIGH (ref 65–99)
Potassium: 4.8 mmol/L (ref 3.5–5.2)
Sodium: 139 mmol/L (ref 134–144)
Total Protein: 7.2 g/dL (ref 6.0–8.5)

## 2017-07-07 LAB — LIPID PANEL
Chol/HDL Ratio: 5.1 ratio — ABNORMAL HIGH (ref 0.0–5.0)
Cholesterol, Total: 252 mg/dL — ABNORMAL HIGH (ref 100–199)
HDL: 49 mg/dL (ref >39)
LDL Calculated: 167 mg/dL — ABNORMAL HIGH (ref 0–99)
Triglycerides: 178 mg/dL — ABNORMAL HIGH (ref 0–149)
VLDL Cholesterol Cal: 36 mg/dL (ref 5–40)

## 2017-07-07 LAB — THYROID PANEL WITH TSH
Free Thyroxine Index: 2.2 (ref 1.2–4.9)
T3 Uptake Ratio: 25 % (ref 24–39)
T4, Total: 8.9 ug/dL (ref 4.5–12.0)
TSH: 2.39 u[IU]/mL (ref 0.450–4.500)

## 2017-07-10 ENCOUNTER — Encounter: Payer: Self-pay | Admitting: Internal Medicine

## 2017-07-10 DIAGNOSIS — E782 Mixed hyperlipidemia: Secondary | ICD-10-CM | POA: Insufficient documentation

## 2017-07-30 DIAGNOSIS — R05 Cough: Secondary | ICD-10-CM | POA: Diagnosis not present

## 2017-07-30 DIAGNOSIS — J019 Acute sinusitis, unspecified: Secondary | ICD-10-CM | POA: Diagnosis not present

## 2017-07-30 DIAGNOSIS — F419 Anxiety disorder, unspecified: Secondary | ICD-10-CM | POA: Diagnosis not present

## 2017-08-31 ENCOUNTER — Ambulatory Visit: Payer: BLUE CROSS/BLUE SHIELD | Admitting: Internal Medicine

## 2017-08-31 ENCOUNTER — Encounter: Payer: Self-pay | Admitting: Internal Medicine

## 2017-08-31 VITALS — BP 130/96 | HR 77 | Ht 71.0 in | Wt 179.0 lb

## 2017-08-31 DIAGNOSIS — F5101 Primary insomnia: Secondary | ICD-10-CM | POA: Diagnosis not present

## 2017-08-31 DIAGNOSIS — F17200 Nicotine dependence, unspecified, uncomplicated: Secondary | ICD-10-CM

## 2017-08-31 MED ORDER — NICOTINE 10 MG/ML NA SOLN: 0.3000 mg | NASAL | 5 refills | Status: AC | PRN

## 2017-08-31 MED ORDER — ZOLPIDEM TARTRATE ER 12.5 MG PO TBCR: 12.5000 mg | EXTENDED_RELEASE_TABLET | Freq: Every evening | ORAL | 5 refills | Status: AC | PRN

## 2017-08-31 NOTE — Progress Notes (Signed)
Date:  08/31/2017   Name:  Nicholas Frost.   DOB:  January 19, 1971   MRN:  [id number]   Chief Complaint: Nicotine Dependence (Tried Chantix. Changed his personality. Felt stress, anxiety, and tension. Was being very aggitated easily. Has taken Wellbutrin in past but dooesn't know whether he should try it again.  )  Tried chantix last fall but it affected his personality.  He was not able to quit.  He has taken bupropion in the past but is listed as an intolerance on his chart. It also affected is mood negatively. Moving to [place] for a new job - does not want to be smoking or have any emotional issues. He also needs a Rx for ambien to take with him.   Nicotine Dependence  Presents for follow-up visit. Symptoms include insomnia. Symptoms are negative for fatigue. His urge triggers include company of smokers. The symptoms have been stable. He smokes 1 pack of cigarettes per day.  Insomnia  Primary symptoms: sleep disturbance.  The onset quality is undetermined. The problem occurs nightly. The problem has been resolved (with Ambien) since onset. The symptoms are aggravated by tobacco and work stress.     Review of Systems  Constitutional: Negative for chills, fatigue and fever.  Respiratory: Negative for chest tightness and shortness of breath.   Cardiovascular: Negative for chest pain and palpitations.  Skin: Negative for color change and rash.  Psychiatric/Behavioral: Positive for sleep disturbance. Negative for dysphoric mood. The patient is nervous/anxious and has insomnia.     Patient Active Problem List   Diagnosis Date Noted  . Hyperlipidemia, mixed 07/10/2017  . Hypothyroidism due to acquired atrophy of thyroid 04/21/2017  . Tobacco use disorder 04/21/2017  . Fatigue 04/21/2017  . Hypogonadism, male 08/21/2015  . ADD (attention deficit disorder) 08/21/2015    Prior to Admission medications   Medication Sig Start Date End Date Taking? Authorizing Provider  levothyroxine  (SYNTHROID, LEVOTHROID) 50 MCG tablet Take 1 tablet (50 mcg total) by mouth daily. 04/22/17  Yes Reubin Milan, MD  zolpidem (AMBIEN CR) 12.5 MG CR tablet Take 1 tablet (12.5 mg total) by mouth at bedtime as needed for sleep. 07/06/17  Yes Reubin Milan, MD    Allergies  Allergen Reactions  . Biaxin [Clarithromycin] Diarrhea  . Bupropion Other (See Comments)  . Codeine Sulfate Itching  . Trazodone And Nefazodone Other (See Comments)    Hyper somnolence    History reviewed. No pertinent surgical history.  Social History   Tobacco Use  . Smoking status: Current Every Day Smoker    Packs/day: 1.00    Years: 1.00    Pack years: 1.00  . Smokeless tobacco: Never Used  Substance Use Topics  . Alcohol use: No    Alcohol/week: 0.0 oz  . Drug use: Not on file     Medication list has been reviewed and updated.  PHQ 2/9 Scores 04/21/2017  PHQ - 2 Score 0    Physical Exam  Constitutional: He is oriented to person, place, and time. He appears well-developed. No distress.  HENT:  Head: Normocephalic and atraumatic.  Neck: Normal range of motion.  Cardiovascular: Normal rate, regular rhythm and normal heart sounds.  Pulmonary/Chest: Effort normal and breath sounds normal. No respiratory distress. He has no wheezes.  Musculoskeletal: Normal range of motion.  Neurological: He is alert and oriented to person, place, and time.  Skin: Skin is warm and dry. No rash noted.  Psychiatric: He has a  normal mood and affect. His behavior is normal. Thought content normal.  Nursing note and vitals reviewed.   BP (!) 130/96   Pulse 77   Ht 5\' 11"  (1.803 m)   Wt 179 lb (81.2 kg)   SpO2 98%   BMI 24.97 kg/m   Assessment and Plan: 1. Tobacco use disorder - Nicotine 10 MG/ML SOLN; Place 0.03 mLs (0.3 mg total) into the nose every hour as needed.  Dispense: 10 mL; Refill: 5  2. Primary insomnia - zolpidem (AMBIEN CR) 12.5 MG CR tablet; Take 1 tablet (12.5 mg total) by mouth at bedtime  as needed for sleep.  Dispense: 30 tablet; Refill: 5   Meds ordered this encounter  Medications  . Nicotine 10 MG/ML SOLN    Sig: Place 0.03 mLs (0.3 mg total) into the nose every hour as needed.    Dispense:  10 mL    Refill:  5  . zolpidem (AMBIEN CR) 12.5 MG CR tablet    Sig: Take 1 tablet (12.5 mg total) by mouth at bedtime as needed for sleep.    Dispense:  30 tablet    Refill:  5    Partially dictated using Animal nutritionistDragon software. Any errors are unintentional.  Bari EdwardLaura Naylin Burkle, MD [facility]  08/31/2017

## 2017-10-14 ENCOUNTER — Other Ambulatory Visit: Payer: Self-pay | Admitting: Internal Medicine

## 2018-05-08 ENCOUNTER — Other Ambulatory Visit: Payer: Self-pay | Admitting: Internal Medicine

## 2018-07-26 ENCOUNTER — Other Ambulatory Visit: Payer: Self-pay | Admitting: Internal Medicine

## 2018-10-28 DIAGNOSIS — S61215A Laceration without foreign body of left ring finger without damage to nail, initial encounter: Secondary | ICD-10-CM | POA: Diagnosis not present

## 2019-01-05 DIAGNOSIS — J019 Acute sinusitis, unspecified: Secondary | ICD-10-CM | POA: Diagnosis not present

## 2019-04-19 DIAGNOSIS — H00024 Hordeolum internum left upper eyelid: Secondary | ICD-10-CM | POA: Diagnosis not present

## 2019-05-07 DIAGNOSIS — Z13220 Encounter for screening for lipoid disorders: Secondary | ICD-10-CM | POA: Diagnosis not present

## 2019-05-07 DIAGNOSIS — Z0000 Encounter for general adult medical examination without abnormal findings: Secondary | ICD-10-CM | POA: Diagnosis not present

## 2019-05-07 DIAGNOSIS — Z125 Encounter for screening for malignant neoplasm of prostate: Secondary | ICD-10-CM | POA: Diagnosis not present

## 2019-05-07 DIAGNOSIS — E611 Iron deficiency: Secondary | ICD-10-CM | POA: Diagnosis not present

## 2019-05-07 DIAGNOSIS — E039 Hypothyroidism, unspecified: Secondary | ICD-10-CM | POA: Diagnosis not present

## 2019-05-07 DIAGNOSIS — Z23 Encounter for immunization: Secondary | ICD-10-CM | POA: Diagnosis not present

## 2019-05-31 DIAGNOSIS — R0683 Snoring: Secondary | ICD-10-CM | POA: Diagnosis not present
